# Patient Record
Sex: Female | Born: 2000 | Race: Black or African American | Hispanic: No | Marital: Single
Health system: Southern US, Community
[De-identification: ages and names within clinical notes are randomized; demographics above are authoritative.]

## PROBLEM LIST (undated history)

## (undated) HISTORY — PX: ANTERIOR CRUCIATE LIGAMENT REPAIR: SHX115

---

## 2020-03-26 ENCOUNTER — Ambulatory Visit: Payer: Self-pay | Attending: Family

## 2020-03-26 DIAGNOSIS — Z23 Encounter for immunization: Secondary | ICD-10-CM

## 2020-03-26 NOTE — Progress Notes (Signed)
   Covid-19 Vaccination Clinic  Name:  Erica Duke    MRN: [id number] DOB: May 05, 2001  03/26/2020  Ms. Erica Duke was observed post Covid-19 immunization for 15 minutes without incident. She was provided with Vaccine Information Sheet and instruction to access the V-Safe system.   Ms. Erica Duke was instructed to call 911 with any severe reactions post vaccine: Marland Kitchen Difficulty breathing  . Swelling of face and throat  . A fast heartbeat  . A bad rash all over body  . Dizziness and weakness   Immunizations Administered    Name Date Dose VIS Date Route   Moderna COVID-19 Vaccine 03/26/2020 11:29 AM 0.5 mL 11/19/2019 Intramuscular   Manufacturer: Moderna   Lot: [id number]   NDC: 37048-889-16

## 2020-04-28 ENCOUNTER — Ambulatory Visit: Payer: Self-pay | Attending: Family

## 2020-04-28 DIAGNOSIS — Z23 Encounter for immunization: Secondary | ICD-10-CM

## 2020-04-28 NOTE — Progress Notes (Signed)
   Covid-19 Vaccination Clinic  Name:  Daziyah Cogan    MRN: [id number] DOB: [birth year]  04/28/2020  Ms. Mateo Flow was observed post Covid-19 immunization for 15 minutes without incident. She was provided with Vaccine Information Sheet and instruction to access the V-Safe system.   Ms. Mateo Flow was instructed to call 911 with any severe reactions post vaccine: Marland Kitchen Difficulty breathing  . Swelling of face and throat  . A fast heartbeat  . A bad rash all over body  . Dizziness and weakness   Immunizations Administered    Name Date Dose VIS Date Route   Moderna COVID-19 Vaccine 04/28/2020 11:03 AM 0.5 mL 11/2019 Intramuscular   Manufacturer: Moderna   Lot: [id number]   NDC: 42395-320-23

## 2020-09-11 ENCOUNTER — Ambulatory Visit (HOSPITAL_COMMUNITY)
Admission: EM | Admit: 2020-09-11 | Discharge: 2020-09-11 | Disposition: A | Discharge status: Home / Self Care | Payer: Self-pay

## 2020-09-11 ENCOUNTER — Emergency Department (HOSPITAL_COMMUNITY): Payer: 59

## 2020-09-11 ENCOUNTER — Other Ambulatory Visit: Payer: Self-pay

## 2020-09-11 ENCOUNTER — Encounter (HOSPITAL_COMMUNITY): Payer: Self-pay | Admitting: Emergency Medicine

## 2020-09-11 ENCOUNTER — Emergency Department (HOSPITAL_COMMUNITY)
Admission: EM | Admit: 2020-09-11 | Discharge: 2020-09-11 | Disposition: A | Discharge status: Home / Self Care | Payer: 59 | Attending: Emergency Medicine | Admitting: Emergency Medicine

## 2020-09-11 DIAGNOSIS — S0003XA Contusion of scalp, initial encounter: Secondary | ICD-10-CM | POA: Diagnosis not present

## 2020-09-11 DIAGNOSIS — Y9389 Activity, other specified: Secondary | ICD-10-CM | POA: Diagnosis not present

## 2020-09-11 DIAGNOSIS — S0990XA Unspecified injury of head, initial encounter: Secondary | ICD-10-CM | POA: Diagnosis present

## 2020-09-11 DIAGNOSIS — S060X0A Concussion without loss of consciousness, initial encounter: Secondary | ICD-10-CM

## 2020-09-11 DIAGNOSIS — R42 Dizziness and giddiness: Secondary | ICD-10-CM | POA: Diagnosis not present

## 2020-09-11 DIAGNOSIS — Y92410 Unspecified street and highway as the place of occurrence of the external cause: Secondary | ICD-10-CM | POA: Insufficient documentation

## 2020-09-11 MED ORDER — METOCLOPRAMIDE HCL 10 MG PO TABS: 10.0000 mg | ORAL_TABLET | Freq: Four times a day (QID) | ORAL | 0 refills | Status: AC | PRN

## 2020-09-11 MED ORDER — ACETAMINOPHEN 325 MG PO TABS
650.0000 mg | ORAL_TABLET | Freq: Once | ORAL | Status: AC
  Administered 2020-09-11: 650 mg via ORAL
  Filled 2020-09-11: qty 2

## 2020-09-11 MED ORDER — METOCLOPRAMIDE HCL 10 MG PO TABS
10.0000 mg | ORAL_TABLET | Freq: Once | ORAL | Status: AC
  Administered 2020-09-11: 10 mg via ORAL
  Filled 2020-09-11: qty 1
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
## 2020-09-11 NOTE — ED Notes (Signed)
Pt transported to CT ?
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
## 2020-09-11 NOTE — Discharge Instructions (Signed)
Take reglan for nausea or headaches   Take tylenol, motrin for headaches   Rest for 2-3 days   See your doctor   Return to ER if you have worse headaches, vomiting, dizziness.

## 2021-01-05 ENCOUNTER — Ambulatory Visit: Payer: 59

## 2021-01-07 ENCOUNTER — Ambulatory Visit: Payer: 59

## 2021-01-23 ENCOUNTER — Ambulatory Visit
Admission: EM | Admit: 2021-01-23 | Discharge: 2021-01-23 | Disposition: A | Discharge status: Home / Self Care | Payer: 59 | Attending: Emergency Medicine | Admitting: Emergency Medicine

## 2021-01-23 ENCOUNTER — Encounter: Payer: Self-pay | Admitting: Emergency Medicine

## 2021-01-23 DIAGNOSIS — J36 Peritonsillar abscess: Secondary | ICD-10-CM | POA: Diagnosis not present

## 2021-01-23 MED ORDER — IBUPROFEN 800 MG PO TABS: 800.0000 mg | ORAL_TABLET | Freq: Three times a day (TID) | ORAL | 0 refills | Status: AC

## 2021-01-23 MED ORDER — AMOXICILLIN-POT CLAVULANATE 875-125 MG PO TABS: 1.0000 | ORAL_TABLET | Freq: Two times a day (BID) | ORAL | 0 refills | Status: AC

## 2021-01-23 NOTE — ED Triage Notes (Signed)
Pt is covid + as of last week and now she is having severe pain in her right tonsil with swelling on her right side of her neck area and hard to swallow.

## 2021-01-23 NOTE — Discharge Instructions (Signed)
Begin Augmentin twice daily for the next 10 days, please take full course Ibuprofen and Tylenol for pain and swelling Warm compresses to neck Please follow-up if not improving or worsening, go to emergency room if having any difficulty breathing or shortness of breath

## 2021-01-23 NOTE — ED Provider Notes (Signed)
[facility]    CSN: [id number] Arrival date & time: 01/23/21  1212      History   Chief Complaint Chief Complaint  Patient presents with  . Sore Throat  . Chills    HPI Erica Duke is a 20 y.o. female presenting today for evaluation of sore throat. Reports woke up this morning with severe pain to her right tonsil area. Reports area of swelling noted in this area. Reports recent Covid infection at the end of January, has since tested negative. She denies any associated cough, congestion or fevers. Has had some chills. HPI  History reviewed. No pertinent past medical history.  There are no problems to display for this patient.   Past Surgical History:  Procedure Laterality Date  . ANTERIOR CRUCIATE LIGAMENT REPAIR      OB History   No obstetric history on file.      Home Medications    Prior to Admission medications   Medication Sig Start Date End Date Taking? Authorizing Provider  amoxicillin-clavulanate (AUGMENTIN) 875-125 MG tablet Take 1 tablet by mouth every 12 (twelve) hours for 10 days. 01/23/21 02/02/21 Yes Sueo Cullen C, PA-C  ibuprofen (ADVIL) 800 MG tablet Take 1 tablet (800 mg total) by mouth 3 (three) times daily. 01/23/21  Yes Alvena Kiernan C, PA-C  metoCLOPramide (REGLAN) 10 MG tablet Take 1 tablet (10 mg total) by mouth every 6 (six) hours as needed for nausea (nausea/headache). 09/11/20   Charlynne Pander, MD    Family History History reviewed. No pertinent family history.  Social History     Allergies   Patient has no known allergies.   Review of Systems Review of Systems  Constitutional: Negative for activity change, appetite change, chills, fatigue and fever.  HENT: Positive for sore throat. Negative for congestion, ear pain, rhinorrhea, sinus pressure and trouble swallowing.   Eyes: Negative for discharge and redness.  Respiratory: Negative for cough, chest tightness and shortness of breath.   Cardiovascular: Negative  for chest pain.  Gastrointestinal: Negative for abdominal pain, diarrhea, nausea and vomiting.  Musculoskeletal: Negative for myalgias.  Skin: Negative for rash.  Neurological: Negative for dizziness, light-headedness and headaches.     Physical Exam Triage Vital Signs ED Triage Vitals  Enc Vitals Group     BP 01/23/21 1234 102/60     Pulse Rate 01/23/21 1234 81     Resp 01/23/21 1234 16     Temp 01/23/21 1234 99.3 F (37.4 C)     Temp Source 01/23/21 1234 Oral     SpO2 01/23/21 1234 99 %     Weight --      Height --      Head Circumference --      Peak Flow --      Pain Score 01/23/21 1233 8     Pain Loc --      Pain Edu? --      Excl. in GC? --    No data found.  Updated Vital Signs BP 102/60 (BP Location: Right Arm)   Pulse 81   Temp 99.3 F (37.4 C) (Oral)   Resp 16   SpO2 99%   Visual Acuity Right Eye Distance:   Left Eye Distance:   Bilateral Distance:    Right Eye Near:   Left Eye Near:    Bilateral Near:     Physical Exam Vitals and nursing note reviewed.  Constitutional:      Appearance: She is well-developed and well-nourished.  Comments: No acute distress  HENT:     Head: Normocephalic and atraumatic.     Ears:     Comments: Bilateral ears without tenderness to palpation of external auricle, tragus and mastoid, EAC's without erythema or swelling, TM's with good bony landmarks and cone of light. Non erythematous.     Nose: Nose normal.     Mouth/Throat:     Comments: Oral mucosa pink and moist, right tonsillar area with erythema extending to soft palate, no soft palate swelling, exudate present diffusely, posterior pharynx patent, uvula midline without deviation, no muffled voice Eyes:     Conjunctiva/sclera: Conjunctivae normal.  Cardiovascular:     Rate and Rhythm: Normal rate.  Pulmonary:     Effort: Pulmonary effort is normal. No respiratory distress.     Comments: Breathing comfortably at rest, CTABL, no wheezing, rales or other  adventitious sounds auscultated Abdominal:     General: There is no distension.  Musculoskeletal:        General: Normal range of motion.     Cervical back: Neck supple.  Skin:    General: Skin is warm and dry.  Neurological:     Mental Status: She is alert and oriented to person, place, and time.  Psychiatric:        Mood and Affect: Mood and affect normal.      UC Treatments / Results  Labs (all labs ordered are listed, but only abnormal results are displayed) Labs Reviewed - No data to display  EKG   Radiology No results found.  Procedures Procedures (including critical care time)  Medications Ordered in UC Medications - No data to display  Initial Impression / Assessment and Plan / UC Course  I have reviewed the triage vital signs and the nursing notes.  Pertinent labs & imaging results that were available during my care of the patient were reviewed by me and considered in my medical decision making (see chart for details).     Unilateral exudative tonsillitis, treating for peritonsillitis with Augmentin to cover for possible early peritonsillar abscess. No significant swelling at this time. Anti-inflammatories and warm compresses. Continue to monitor.  Discussed strict return precautions. Patient verbalized understanding and is agreeable with plan.  Final Clinical Impressions(s) / UC Diagnoses   Final diagnoses:  Peritonsillitis     Discharge Instructions     Begin Augmentin twice daily for the next 10 days, please take full course Ibuprofen and Tylenol for pain and swelling Warm compresses to neck Please follow-up if not improving or worsening, go to emergency room if having any difficulty breathing or shortness of breath    ED Prescriptions    Medication Sig Dispense Auth. Provider   amoxicillin-clavulanate (AUGMENTIN) 875-125 MG tablet Take 1 tablet by mouth every 12 (twelve) hours for 10 days. 20 tablet Jett Fukuda C, PA-C   ibuprofen  (ADVIL) 800 MG tablet Take 1 tablet (800 mg total) by mouth 3 (three) times daily. 21 tablet Jennett Tarbell, [place] C, PA-C     PDMP not reviewed this encounter.   Lew Dawes, [place] 01/23/21 1339

## 2021-04-02 ENCOUNTER — Other Ambulatory Visit: Payer: Self-pay

## 2021-04-02 ENCOUNTER — Emergency Department (HOSPITAL_COMMUNITY)
Admission: EM | Admit: 2021-04-02 | Discharge: 2021-04-03 | Disposition: A | Discharge status: Home / Self Care | Payer: 59 | Attending: Emergency Medicine | Admitting: Emergency Medicine

## 2021-04-02 DIAGNOSIS — R509 Fever, unspecified: Secondary | ICD-10-CM | POA: Insufficient documentation

## 2021-04-02 DIAGNOSIS — Z5321 Procedure and treatment not carried out due to patient leaving prior to being seen by health care provider: Secondary | ICD-10-CM | POA: Diagnosis not present

## 2021-04-02 DIAGNOSIS — M542 Cervicalgia: Secondary | ICD-10-CM | POA: Insufficient documentation

## 2021-04-02 NOTE — ED Notes (Signed)
Pt checked out AMA due to long wait time. MSE form signed.
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
